# Patient Record
Sex: Male | Born: 1940 | Race: White | Hispanic: No | Marital: Married | Smoking: Never smoker
Health system: Southern US, Community
[De-identification: ages and names within clinical notes are randomized; demographics above are authoritative.]

## PROBLEM LIST (undated history)

## (undated) DIAGNOSIS — K219 Gastro-esophageal reflux disease without esophagitis: Secondary | ICD-10-CM

## (undated) DIAGNOSIS — T8859XA Other complications of anesthesia, initial encounter: Secondary | ICD-10-CM

## (undated) DIAGNOSIS — D45 Polycythemia vera: Secondary | ICD-10-CM

## (undated) DIAGNOSIS — R42 Dizziness and giddiness: Secondary | ICD-10-CM

## (undated) DIAGNOSIS — I10 Essential (primary) hypertension: Secondary | ICD-10-CM

## (undated) DIAGNOSIS — T4145XA Adverse effect of unspecified anesthetic, initial encounter: Secondary | ICD-10-CM

## (undated) HISTORY — PX: BACK SURGERY: SHX140

## (undated) HISTORY — PX: CIRCUMCISION: SUR203

## (undated) HISTORY — PX: VASECTOMY: SHX75

## (undated) HISTORY — PX: TONSILLECTOMY: SUR1361

## (undated) HISTORY — PX: HERNIA REPAIR: SHX51

## (undated) HISTORY — PX: ESOPHAGOGASTRODUODENOSCOPY ENDOSCOPY: SHX5814

## (undated) HISTORY — PX: APPENDECTOMY: SHX54

## (undated) HISTORY — PX: MOUTH SURGERY: SHX715

## (undated) HISTORY — PX: OTHER SURGICAL HISTORY: SHX169

---

## 2009-10-02 DEATH — deceased

## 2015-04-07 ENCOUNTER — Emergency Department (HOSPITAL_COMMUNITY): Payer: Medicare HMO

## 2015-04-07 ENCOUNTER — Encounter (HOSPITAL_COMMUNITY): Payer: Self-pay

## 2015-04-07 ENCOUNTER — Observation Stay (HOSPITAL_COMMUNITY)
Admission: EM | Admit: 2015-04-07 | Discharge: 2015-04-08 | Disposition: A | Discharge status: Home / Self Care | Payer: Medicare HMO | Attending: Internal Medicine | Admitting: Internal Medicine

## 2015-04-07 DIAGNOSIS — I10 Essential (primary) hypertension: Secondary | ICD-10-CM | POA: Insufficient documentation

## 2015-04-07 DIAGNOSIS — R0602 Shortness of breath: Secondary | ICD-10-CM | POA: Diagnosis not present

## 2015-04-07 DIAGNOSIS — D45 Polycythemia vera: Secondary | ICD-10-CM | POA: Diagnosis not present

## 2015-04-07 DIAGNOSIS — E785 Hyperlipidemia, unspecified: Secondary | ICD-10-CM

## 2015-04-07 DIAGNOSIS — E876 Hypokalemia: Secondary | ICD-10-CM | POA: Insufficient documentation

## 2015-04-07 DIAGNOSIS — Z8249 Family history of ischemic heart disease and other diseases of the circulatory system: Secondary | ICD-10-CM | POA: Insufficient documentation

## 2015-04-07 DIAGNOSIS — R079 Chest pain, unspecified: Secondary | ICD-10-CM | POA: Diagnosis present

## 2015-04-07 DIAGNOSIS — Z79899 Other long term (current) drug therapy: Secondary | ICD-10-CM | POA: Insufficient documentation

## 2015-04-07 DIAGNOSIS — K219 Gastro-esophageal reflux disease without esophagitis: Secondary | ICD-10-CM | POA: Diagnosis not present

## 2015-04-07 DIAGNOSIS — R072 Precordial pain: Principal | ICD-10-CM | POA: Insufficient documentation

## 2015-04-07 DIAGNOSIS — Z7982 Long term (current) use of aspirin: Secondary | ICD-10-CM | POA: Diagnosis not present

## 2015-04-07 HISTORY — DX: Gastro-esophageal reflux disease without esophagitis: K21.9

## 2015-04-07 HISTORY — DX: Essential (primary) hypertension: I10

## 2015-04-07 HISTORY — DX: Dizziness and giddiness: R42

## 2015-04-07 HISTORY — DX: Adverse effect of unspecified anesthetic, initial encounter: T41.45XA

## 2015-04-07 HISTORY — DX: Polycythemia vera: D45

## 2015-04-07 HISTORY — DX: Other complications of anesthesia, initial encounter: T88.59XA

## 2015-04-07 LAB — CBC WITH DIFFERENTIAL/PLATELET
Basophils Absolute: 0.1 10*3/uL (ref 0.0–0.1)
Basophils Relative: 1 % (ref 0–1)
EOS ABS: 0.1 10*3/uL (ref 0.0–0.7)
Eosinophils Relative: 1 % (ref 0–5)
HCT: 49.9 % (ref 39.0–52.0)
HEMOGLOBIN: 17.4 g/dL — AB (ref 13.0–17.0)
Lymphocytes Relative: 12 % (ref 12–46)
Lymphs Abs: 1.4 10*3/uL (ref 0.7–4.0)
MCH: 34.7 pg — AB (ref 26.0–34.0)
MCHC: 34.9 g/dL (ref 30.0–36.0)
MCV: 99.4 fL (ref 78.0–100.0)
MONO ABS: 0.6 10*3/uL (ref 0.1–1.0)
MONOS PCT: 5 % (ref 3–12)
Neutro Abs: 9.5 10*3/uL — ABNORMAL HIGH (ref 1.7–7.7)
Neutrophils Relative %: 81 % — ABNORMAL HIGH (ref 43–77)
Platelets: 356 10*3/uL (ref 150–400)
RBC: 5.02 MIL/uL (ref 4.22–5.81)
RDW: 12.4 % (ref 11.5–15.5)
WBC: 11.6 10*3/uL — ABNORMAL HIGH (ref 4.0–10.5)

## 2015-04-07 LAB — GLUCOSE, CAPILLARY: GLUCOSE-CAPILLARY: 139 mg/dL — AB (ref 65–99)

## 2015-04-07 LAB — LIPID PANEL
Cholesterol: 128 mg/dL (ref 0–200)
HDL: 23 mg/dL — ABNORMAL LOW (ref >40)
LDL CALC: 33 mg/dL (ref 0–99)
Total CHOL/HDL Ratio: 5.6 RATIO
Triglycerides: 361 mg/dL — ABNORMAL HIGH (ref <150)
VLDL: 72 mg/dL — ABNORMAL HIGH (ref 0–40)

## 2015-04-07 LAB — BASIC METABOLIC PANEL
Anion gap: 13 (ref 5–15)
BUN: 18 mg/dL (ref 6–20)
CALCIUM: 9.7 mg/dL (ref 8.9–10.3)
CO2: 27 mmol/L (ref 22–32)
Chloride: 98 mmol/L — ABNORMAL LOW (ref 101–111)
Creatinine, Ser: 1.13 mg/dL (ref 0.61–1.24)
GFR calc non Af Amer: >60 mL/min (ref >60)
Glucose, Bld: 224 mg/dL — ABNORMAL HIGH (ref 65–99)
POTASSIUM: 2.8 mmol/L — AB (ref 3.5–5.1)
SODIUM: 138 mmol/L (ref 135–145)

## 2015-04-07 LAB — BRAIN NATRIURETIC PEPTIDE: B Natriuretic Peptide: 44.3 pg/mL (ref 0.0–100.0)

## 2015-04-07 LAB — CBC
HCT: 44.3 % (ref 39.0–52.0)
HEMOGLOBIN: 15.6 g/dL (ref 13.0–17.0)
MCH: 34.9 pg — AB (ref 26.0–34.0)
MCHC: 35.2 g/dL (ref 30.0–36.0)
MCV: 99.1 fL (ref 78.0–100.0)
Platelets: 291 10*3/uL (ref 150–400)
RBC: 4.47 MIL/uL (ref 4.22–5.81)
RDW: 12.2 % (ref 11.5–15.5)
WBC: 9.6 10*3/uL (ref 4.0–10.5)

## 2015-04-07 LAB — TROPONIN I
Troponin I: <0.03 ng/mL (ref <0.031)
Troponin I: <0.03 ng/mL (ref <0.031)
Troponin I: <0.03 ng/mL (ref <0.031)

## 2015-04-07 LAB — TSH: TSH: 1.593 u[IU]/mL (ref 0.350–4.500)

## 2015-04-07 LAB — CREATININE, SERUM
CREATININE: 0.93 mg/dL (ref 0.61–1.24)
GFR calc Af Amer: >60 mL/min (ref >60)
GFR calc non Af Amer: >60 mL/min (ref >60)

## 2015-04-07 LAB — MRSA PCR SCREENING: MRSA by PCR: NEGATIVE

## 2015-04-07 LAB — I-STAT TROPONIN, ED: Troponin i, poc: 0 ng/mL (ref 0.00–0.08)

## 2015-04-07 MED ORDER — ONDANSETRON HCL 4 MG PO TABS: 4.0000 mg | ORAL_TABLET | Freq: Four times a day (QID) | ORAL | Status: DC | PRN

## 2015-04-07 MED ORDER — SODIUM CHLORIDE 0.9 % IJ SOLN
3.0000 mL | Freq: Two times a day (BID) | INTRAMUSCULAR | Status: DC
  Administered 2015-04-07 – 2015-04-08 (×2): 3 mL via INTRAVENOUS

## 2015-04-07 MED ORDER — HYDROMORPHONE HCL 1 MG/ML IJ SOLN
0.5000 mg | Freq: Once | INTRAMUSCULAR | Status: AC
  Administered 2015-04-07: 0.5 mg via INTRAVENOUS
  Filled 2015-04-07: qty 1

## 2015-04-07 MED ORDER — AMLODIPINE BESYLATE 10 MG PO TABS
10.0000 mg | ORAL_TABLET | Freq: Every day | ORAL | Status: DC
  Administered 2015-04-07 – 2015-04-08 (×2): 10 mg via ORAL
  Filled 2015-04-07 (×2): qty 1

## 2015-04-07 MED ORDER — ACETAMINOPHEN 325 MG PO TABS
650.0000 mg | ORAL_TABLET | Freq: Four times a day (QID) | ORAL | Status: DC | PRN
  Administered 2015-04-08: 650 mg via ORAL
  Filled 2015-04-07: qty 2

## 2015-04-07 MED ORDER — HYDROCHLOROTHIAZIDE 25 MG PO TABS
25.0000 mg | ORAL_TABLET | Freq: Every day | ORAL | Status: DC
  Administered 2015-04-07 – 2015-04-08 (×2): 25 mg via ORAL
  Filled 2015-04-07 (×2): qty 1

## 2015-04-07 MED ORDER — HYDROMORPHONE HCL 1 MG/ML IJ SOLN: 1.0000 mg | INTRAMUSCULAR | Status: DC | PRN

## 2015-04-07 MED ORDER — ACETAMINOPHEN 650 MG RE SUPP: 650.0000 mg | Freq: Four times a day (QID) | RECTAL | Status: DC | PRN

## 2015-04-07 MED ORDER — ONDANSETRON HCL 4 MG/2ML IJ SOLN
4.0000 mg | Freq: Four times a day (QID) | INTRAMUSCULAR | Status: DC | PRN
  Administered 2015-04-07: 4 mg via INTRAVENOUS
  Filled 2015-04-07: qty 2

## 2015-04-07 MED ORDER — MORPHINE SULFATE 4 MG/ML IJ SOLN
4.0000 mg | Freq: Once | INTRAMUSCULAR | Status: AC
  Administered 2015-04-07: 4 mg via INTRAVENOUS
  Filled 2015-04-07: qty 1

## 2015-04-07 MED ORDER — HYDROXYUREA 500 MG PO CAPS
500.0000 mg | ORAL_CAPSULE | Freq: Every day | ORAL | Status: DC
  Administered 2015-04-07 – 2015-04-08 (×2): 500 mg via ORAL
  Filled 2015-04-07 (×2): qty 1

## 2015-04-07 MED ORDER — ENOXAPARIN SODIUM 40 MG/0.4ML ~~LOC~~ SOLN
40.0000 mg | SUBCUTANEOUS | Status: DC
  Administered 2015-04-07: 40 mg via SUBCUTANEOUS
  Filled 2015-04-07: qty 0.4

## 2015-04-07 MED ORDER — SODIUM CHLORIDE 0.9 % IV SOLN: INTRAVENOUS | Status: DC

## 2015-04-07 MED ORDER — ASPIRIN 81 MG PO CHEW
81.0000 mg | CHEWABLE_TABLET | Freq: Every day | ORAL | Status: DC
  Administered 2015-04-07 – 2015-04-08 (×2): 81 mg via ORAL
  Filled 2015-04-07 (×2): qty 1

## 2015-04-07 MED ORDER — POTASSIUM CHLORIDE 10 MEQ/100ML IV SOLN
10.0000 meq | INTRAVENOUS | Status: AC
  Administered 2015-04-07 (×2): 10 meq via INTRAVENOUS
  Filled 2015-04-07 (×2): qty 100

## 2015-04-07 MED ORDER — SODIUM CHLORIDE 0.9 % IV BOLUS (SEPSIS)
1000.0000 mL | Freq: Once | INTRAVENOUS | Status: AC
  Administered 2015-04-07: 1000 mL via INTRAVENOUS

## 2015-04-07 MED ORDER — POTASSIUM CHLORIDE CRYS ER 20 MEQ PO TBCR
40.0000 meq | EXTENDED_RELEASE_TABLET | Freq: Once | ORAL | Status: AC
  Administered 2015-04-07: 40 meq via ORAL
  Filled 2015-04-07: qty 2

## 2015-04-07 MED ORDER — METOPROLOL TARTRATE 25 MG PO TABS
25.0000 mg | ORAL_TABLET | Freq: Two times a day (BID) | ORAL | Status: DC
  Administered 2015-04-07 – 2015-04-08 (×3): 25 mg via ORAL
  Filled 2015-04-07 (×3): qty 1

## 2015-04-07 MED ORDER — PANTOPRAZOLE SODIUM 40 MG IV SOLR
40.0000 mg | INTRAVENOUS | Status: AC
  Administered 2015-04-07: 40 mg via INTRAVENOUS
  Filled 2015-04-07: qty 40

## 2015-04-07 MED ORDER — PANTOPRAZOLE SODIUM 40 MG PO TBEC
40.0000 mg | DELAYED_RELEASE_TABLET | Freq: Every day | ORAL | Status: DC
Start: 2015-04-07 — End: 2015-04-08
  Administered 2015-04-07 – 2015-04-08 (×2): 40 mg via ORAL
  Filled 2015-04-07 (×2): qty 1

## 2015-04-07 NOTE — ED Provider Notes (Signed)
Medical screening examination/treatment/procedure(s) were conducted as a shared visit with non-physician practitioner(s) and myself.  I personally evaluated the patient during the encounter.   EKG Interpretation   Date/Time:  Wednesday April 07 2015 04:09:31 EDT Ventricular Rate:  82 PR Interval:  143 QRS Duration: 115 QT Interval:  417 QTC Calculation: 487 R Axis:   -67 Text Interpretation:  Sinus rhythm Left anterior fascicular block Left  ventricular hypertrophy Nonspecific T abnormalities, inferior leads and  lateral leads No old tracing to compare Confirmed by Timaya Bojarski,  DO, Elowyn Raupp  ([id number]) on 04/07/2015 4:13:33 AM      Pt is a 74 y.o. male with history of hypertension who presents emergency department with chest pressure, stabbing chest pain, shortness of breath, dizziness and nausea. EKG shows T-wave inversions in inferolateral leads with no old for comparison. Troponin negative. Will admit for ACS rule out.  [place], DO 04/07/15 [phone]

## 2015-04-07 NOTE — ED Notes (Signed)
Report given to Ria Comment, RN on floor

## 2015-04-07 NOTE — ED Notes (Signed)
rn called to give report, floor assigning nurse, floor will call back to get report.

## 2015-04-07 NOTE — ED Provider Notes (Signed)
CSN: [id number]     Arrival date & time 04/07/15  0405 History   First MD Initiated Contact with Patient 04/07/15 [phone]     Chief Complaint  Patient presents with  . Chest Pain     (Consider location/radiation/quality/duration/timing/severity/associated sxs/prior Treatment) HPI Comments: 74 year old male with history of hypertension and esophageal reflux presents to the emergency department for further evaluation of chest pain. Patient reports that chest pain began 4.5 hours ago. Pain began as a tightness at first with shortness of breath. It has progressed to a sharp, stabbing pain in the patient's central chest. Patient denies any radiation of the pain. He has not had similar pain in the past. Symptoms have been associated with nausea as well as lightheadedness. Patient denies any vertiginous symptoms, vomiting, syncope, and leg swelling. He took Tums without relief of his symptoms as he thought his pain was initially associated with his history of reflux. Patient reports a history of ACS in his father at the age of 12; this was a fatal MI. Patient denies a personal history of ACS, hyperlipidemia, CAD, and diabetes mellitus. He is a never smoker. He reports a normal stress test a little more than a year ago. He is not followed by a cardiologist.  PCP - Dr. Melford Aase  Patient is a 74 y.o. male presenting with chest pain. The history is provided by the patient. No language interpreter was used.  Chest Pain Associated symptoms: nausea and shortness of breath   Associated symptoms: no diaphoresis     Past Medical History  Diagnosis Date  . Hypertension   . GERD (gastroesophageal reflux disease)    History reviewed. No pertinent past surgical history. History reviewed. No pertinent family history. History  Substance Use Topics  . Smoking status: Never Smoker   . Smokeless tobacco: Not on file  . Alcohol Use: No    Review of Systems  Constitutional: Negative for diaphoresis.  Respiratory:  Positive for shortness of breath.   Cardiovascular: Positive for chest pain.  Gastrointestinal: Positive for nausea.  Neurological: Positive for light-headedness.  All other systems reviewed and are negative.   Allergies  Review of patient's allergies indicates not on file.  Home Medications   Prior to Admission medications   Not on File   BP 146/80 mmHg  Pulse 85  Temp(Src) 98.2 F (36.8 C) (Oral)  Resp 18  SpO2 100%   Physical Exam  Constitutional: He is oriented to person, place, and time. He appears well-developed and well-nourished. No distress.  Nontoxic/nonseptic appearing  HENT:  Head: Normocephalic and atraumatic.  Eyes: Conjunctivae and EOM are normal. No scleral icterus.  Neck: Normal range of motion.  Cardiovascular: Normal rate, regular rhythm and intact distal pulses.   Pulmonary/Chest: Effort normal and breath sounds normal. No respiratory distress. He has no wheezes. He has no rales.  Respirations even and unlabored  Abdominal: Soft. He exhibits no distension. There is no tenderness. There is no rebound and no guarding.  Soft, nontender abdomen  Musculoskeletal: Normal range of motion. He exhibits no edema.  Neurological: He is alert and oriented to person, place, and time. He exhibits normal muscle tone. Coordination normal.  GCS 15. Speech is goal oriented. Patient moves extremities without ataxia. He ambulates with steady gait.  Skin: Skin is warm and dry. No rash noted. He is not diaphoretic. No erythema. No pallor.  Psychiatric: He has a normal mood and affect. His behavior is normal.  Nursing note and vitals reviewed.   ED Course  Procedures (including critical care time) Labs Review Labs Reviewed  BASIC METABOLIC PANEL - Abnormal; Notable for the following:    Potassium 2.8 (*)    Chloride 98 (*)    Glucose, Bld 224 (*)    All other components within normal limits  CBC WITH DIFFERENTIAL/PLATELET - Abnormal; Notable for the following:    WBC  11.6 (*)    Hemoglobin 17.4 (*)    MCH 34.7 (*)    Neutrophils Relative % 81 (*)    Neutro Abs 9.5 (*)    All other components within normal limits  BRAIN NATRIURETIC PEPTIDE  I-STAT TROPOININ, ED    Imaging Review Dg Chest 2 View  04/07/2015   CLINICAL DATA:  Centralized chest pain  EXAM: CHEST  2 VIEW  COMPARISON:  None.  FINDINGS: Normal heart size and mediastinal contours. No acute infiltrate or edema. No effusion or pneumothorax. No acute osseous findings. Gas under the left diaphragm appears to be in the gastric fundus.  IMPRESSION: No active cardiopulmonary disease.   Electronically Signed   By: Monte Fantasia M.D.   On: 04/07/2015 04:31     EKG Interpretation   Date/Time:  Wednesday April 07 2015 04:09:31 EDT Ventricular Rate:  82 PR Interval:  143 QRS Duration: 115 QT Interval:  417 QTC Calculation: 487 R Axis:   -67 Text Interpretation:  Sinus rhythm Left anterior fascicular block Left  ventricular hypertrophy Nonspecific T abnormalities, inferior leads and  lateral leads No old tracing to compare Confirmed by WARD,  DO, KRISTEN  ([id number]) on 04/07/2015 4:13:33 AM       Heart Score - 5 (mod suspicion, T wave inversion inf/lat leads, age, risk factors, normal trop)  MDM   Final diagnoses:  Chest pain  Hypokalemia    Plan admit for ACS rule out. Initial cardiac work up is reassuring. EKG does show flipped T waves in inferior and lateral leads. Trop negative. Heart Score is 5 c/w moderate risk of MACE. Plan discussed with Dr. Posey Pronto of [facility] who will admit.   Filed Vitals:   04/07/15 0415  BP: 146/80  Pulse: 85  Temp: 98.2 F (36.8 C)  TempSrc: Oral  Resp: 18  SpO2: 100%     Antonietta Breach, PA-C 04/07/15 [phone]

## 2015-04-07 NOTE — ED Notes (Signed)
Pt complains of centralized chest pain that he describes as a stabbing pain that started about 4 hours ago, he also complains of being dizzy and nauseated.

## 2015-04-07 NOTE — H&P (Addendum)
[facility] History and Physical  Philip Gay ZOX:[id number] DOB: [birth year] DOA: 04/07/2015  Referring physician: ER physician: Antonietta Breach, PA PCP: Chesley Noon, MD   Chief Complaint: chest pain   HPI:  74 -year-old male with past medical history of hypertension, polycythemia vera, GERD who presented to [facility] ED with concern for progressively worsening sharp, substernal chest pain, 10 out of 10 in intensity, nonradiating, sharp, constant, that woke him up from sleep earlier this morning. Patient reports not having similar episodes of this type of pain before. He reports pain is present at rest and with movement. He was thinking that this may be GERD but there was no significant symptomatic improvement. He's pain actually got better with Dilaudid given in ED. Patient reports having parents who have history of heart attack in his 63s. No complaints of shortness of breath or palpitations. No fevers or chills. No lightheadedness or dizziness or loss of consciousness. No reports of abdominal pain, nausea or vomiting. No reports of blood in the stool or urine. No diarrhea or constipation.  In ED, patient was hemodynamically stable. His initial blood pressure was 146/80 but then was as high as 171/78. He is initial troponin level was within normal limits. Potassium was 2.8, white blood cell count was 11.7 and hemoglobin was 17.4.   he's 12-lead EKG showed sinus rhythm on the admission. Because of ongoing chest pain he was admitted for observation in step down unit for first 24 hours.   Assessment & Plan    Principal problem: Chest pain, precordial - Rule out ACS. Risk factors include hypertension, dyslipidemia, family history. - The troponin level was within normal limits. Cycle cardiac enzymes. - Obtain 2-D echo. - Continue pain management efforts - Continue daily aspirin. - Check lipid panel  Active problems: Essential hypertension - Continue Norvasc, HCTZ and  metoprolol  Polycythemia vera - Continue hydroxyurea  Hypokalemia - Likely from HCTZ - Supplemented and repeat BMP pending  GERD - Continue PPI therapy   DVT prophylaxis:  - SCDs bilaterally  Radiological Exams on Admission: Dg Chest 2 View 04/07/2015  No active cardiopulmonary disease.   Electronically Signed   By: Monte Fantasia M.D.   On: 04/07/2015 04:31    EKG: I have personally reviewed EKG. EKG shows sinus rhythm   Code Status: Full Family Communication: Plan of care discussed with the patient  Disposition Plan: Admit for further evaluation, SDU  Leisa Lenz, MD  [facility] Pager [phone]  Time spent in minutes: 75 minutes  Review of Systems:  Constitutional: Negative for fever, chills and malaise/fatigue. Negative for diaphoresis.  HENT: Negative for hearing loss, ear pain, nosebleeds, congestion, sore throat, neck pain, tinnitus and ear discharge.   Eyes: Negative for blurred vision, double vision, photophobia, pain, discharge and redness.  Respiratory: Negative for cough, hemoptysis, sputum production, shortness of breath, wheezing and stridor.   Cardiovascular: per HPI.  Gastrointestinal: Negative for nausea, vomiting and abdominal pain. Negative for heartburn, constipation, blood in stool and melena.  Genitourinary: Negative for dysuria, urgency, frequency, hematuria and flank pain.  Musculoskeletal: Negative for myalgias, back pain, joint pain and falls.  Skin: Negative for itching and rash.  Neurological: Negative for dizziness and weakness. Negative for tingling, tremors, sensory change, speech change, focal weakness, loss of consciousness and headaches.  Endo/Heme/Allergies: Negative for environmental allergies and polydipsia. Does not bruise/bleed easily.  Psychiatric/Behavioral: Negative for suicidal ideas. The patient is not nervous/anxious.      Past Medical History  Diagnosis Date  .  Hypertension   . GERD (gastroesophageal reflux disease)    . Complication of anesthesia     Hard to wake up  . Vertigo   . Polycythemia vera    Past Surgical History  Procedure Laterality Date  . Tonsillectomy    . Appendectomy    . Tumors from back    . Mouth surgery    . Circumcision    . Colonscopy    . Esophagogastroduodenoscopy endoscopy    . Vasectomy    . Hernia repair      right side  . Back surgery    . Hernia removed from the back     Social History:  reports that he has never smoked. He has never used smokeless tobacco. He reports that he does not drink alcohol or use illicit drugs.  No Known Allergies  Family History: Father and mother had history of heart attack   Prior to Admission medications   Medication Sig Start Date End Date Taking? Authorizing Provider  amLODipine (NORVASC) 10 MG tablet Take 1 tablet by mouth daily. 03/25/15  Yes Historical Provider, MD  aspirin 81 MG chewable tablet Chew 81 mg by mouth daily.   Yes Historical Provider, MD  hydrochlorothiazide (HYDRODIURIL) 25 MG tablet Take 1 tablet by mouth every morning. 01/18/15  Yes Historical Provider, MD  hydroxyurea (HYDREA) 500 MG capsule Take 500 mg by mouth daily. May take with food to minimize GI side effects.   Yes Historical Provider, MD  metoprolol tartrate (LOPRESSOR) 25 MG tablet Take 1 tablet by mouth 2 (two) times daily. 01/18/15  Yes Historical Provider, MD  omeprazole (PRILOSEC) 20 MG capsule Take 20 mg by mouth daily.   Yes Historical Provider, MD   Physical Exam: Filed Vitals:   04/07/15 0900 04/07/15 0950 04/07/15 1000 04/07/15 1100  BP: 171/78  146/74 160/61  Pulse: 85  79 78  Temp:      TempSrc:      Resp: 14  14 21   Height:  5\' 8"  (1.727 m)    Weight:  87.5 kg (192 lb 14.4 oz)    SpO2: 97%  95% 91%    Physical Exam  Constitutional: Appears well-developed and well-nourished. No distress.  HENT: Normocephalic. No tonsillar erythema or exudates Eyes: Conjunctivae and EOM are normal. PERRLA, no scleral icterus.  Neck: Normal ROM.  Neck supple. No JVD. No tracheal deviation. No thyromegaly.  CVS: RRR, S1/S2 +, no murmurs, no gallops, no carotid bruit.  Pulmonary: Effort and breath sounds normal, no stridor, rhonchi, wheezes, rales.  Abdominal: Soft. BS +,  no distension, tenderness, rebound or guarding.  Musculoskeletal: Normal range of motion. No edema and no tenderness.  Lymphadenopathy: No lymphadenopathy noted, cervical, inguinal. Neuro: Alert. Normal reflexes, muscle tone coordination. No focal neurologic deficits. Skin: Skin is warm and dry. No rash noted.  No erythema. No pallor.  Psychiatric: Normal mood and affect. Behavior, judgment, thought content normal.   Labs on Admission:  Basic Metabolic Panel:  Recent Labs Lab 04/07/15 0429 04/07/15 1010  NA 138  --   K 2.8*  --   CL 98*  --   CO2 27  --   GLUCOSE 224*  --   BUN 18  --   CREATININE 1.13 0.93  CALCIUM 9.7  --    Liver Function Tests: No results for input(s): AST, ALT, ALKPHOS, BILITOT, PROT, ALBUMIN in the last 168 hours. No results for input(s): LIPASE, AMYLASE in the last 168 hours. No results for input(s): AMMONIA  in the last 168 hours. CBC:  Recent Labs Lab 04/07/15 0429 04/07/15 1010  WBC 11.6* 9.6  NEUTROABS 9.5*  --   HGB 17.4* 15.6  HCT 49.9 44.3  MCV 99.4 99.1  PLT 356 291   Cardiac Enzymes:  Recent Labs Lab 04/07/15 0557 04/07/15 1010  TROPONINI <0.03 <0.03   BNP: Invalid input(s): POCBNP CBG: No results for input(s): GLUCAP in the last 168 hours.  If 7PM-7AM, please contact night-coverage [email or web address] Password [email or web address] 04/07/2015, 11:49 AM

## 2015-04-07 NOTE — ED Notes (Signed)
Pt alert and oriented x4. Respirations even and unlabored, bilateral symmetrical rise and fall of chest. Skin warm and dry. In no acute distress. Denies needs.   

## 2015-04-08 ENCOUNTER — Observation Stay (HOSPITAL_COMMUNITY): Payer: Medicare HMO

## 2015-04-08 DIAGNOSIS — K219 Gastro-esophageal reflux disease without esophagitis: Secondary | ICD-10-CM | POA: Diagnosis not present

## 2015-04-08 DIAGNOSIS — I10 Essential (primary) hypertension: Secondary | ICD-10-CM | POA: Diagnosis not present

## 2015-04-08 DIAGNOSIS — E876 Hypokalemia: Secondary | ICD-10-CM

## 2015-04-08 DIAGNOSIS — R079 Chest pain, unspecified: Secondary | ICD-10-CM | POA: Diagnosis not present

## 2015-04-08 DIAGNOSIS — R072 Precordial pain: Secondary | ICD-10-CM | POA: Diagnosis not present

## 2015-04-08 LAB — BASIC METABOLIC PANEL
Anion gap: 9 (ref 5–15)
BUN: 13 mg/dL (ref 6–20)
CHLORIDE: 96 mmol/L — AB (ref 101–111)
CO2: 29 mmol/L (ref 22–32)
CREATININE: 1.09 mg/dL (ref 0.61–1.24)
Calcium: 9.2 mg/dL (ref 8.9–10.3)
GFR calc Af Amer: >60 mL/min (ref >60)
GFR calc non Af Amer: >60 mL/min (ref >60)
Glucose, Bld: 181 mg/dL — ABNORMAL HIGH (ref 65–99)
POTASSIUM: 2.6 mmol/L — AB (ref 3.5–5.1)
Sodium: 134 mmol/L — ABNORMAL LOW (ref 135–145)

## 2015-04-08 LAB — CBC
HCT: 42.7 % (ref 39.0–52.0)
Hemoglobin: 15 g/dL (ref 13.0–17.0)
MCH: 34.6 pg — AB (ref 26.0–34.0)
MCHC: 35.1 g/dL (ref 30.0–36.0)
MCV: 98.6 fL (ref 78.0–100.0)
PLATELETS: 248 10*3/uL (ref 150–400)
RBC: 4.33 MIL/uL (ref 4.22–5.81)
RDW: 12.2 % (ref 11.5–15.5)
WBC: 10 10*3/uL (ref 4.0–10.5)

## 2015-04-08 LAB — GLUCOSE, CAPILLARY: Glucose-Capillary: 170 mg/dL — ABNORMAL HIGH (ref 65–99)

## 2015-04-08 MED ORDER — POTASSIUM CHLORIDE CRYS ER 20 MEQ PO TBCR: 40.0000 meq | EXTENDED_RELEASE_TABLET | Freq: Once | ORAL | Status: DC

## 2015-04-08 MED ORDER — POTASSIUM CHLORIDE 20 MEQ/15ML (10%) PO SOLN
40.0000 meq | Freq: Once | ORAL | Status: AC
  Administered 2015-04-08: 40 meq via ORAL
  Filled 2015-04-08: qty 30

## 2015-04-08 MED ORDER — POTASSIUM CHLORIDE ER 20 MEQ PO TBCR: 20.0000 meq | EXTENDED_RELEASE_TABLET | Freq: Every day | ORAL | Status: AC

## 2015-04-08 MED ORDER — OMEGA-3-ACID ETHYL ESTERS 1 G PO CAPS: 1.0000 g | ORAL_CAPSULE | Freq: Every day | ORAL | Status: DC

## 2015-04-08 MED ORDER — POTASSIUM CHLORIDE ER 20 MEQ PO TBCR: 20.0000 meq | EXTENDED_RELEASE_TABLET | Freq: Every day | ORAL | Status: DC

## 2015-04-08 MED ORDER — OMEGA-3-ACID ETHYL ESTERS 1 G PO CAPS: 1.0000 g | ORAL_CAPSULE | Freq: Every day | ORAL | Status: AC

## 2015-04-08 MED ORDER — POTASSIUM CHLORIDE CRYS ER 20 MEQ PO TBCR
40.0000 meq | EXTENDED_RELEASE_TABLET | Freq: Once | ORAL | Status: AC
  Administered 2015-04-08: 40 meq via ORAL
  Filled 2015-04-08: qty 2

## 2015-04-08 NOTE — Discharge Summary (Addendum)
Physician Discharge Summary  Shawna Kiener VPX:[id number] DOB: 1940-10-12 DOA: 04/07/2015  PCP: Chesley Noon, MD  Admit date: 04/07/2015 Discharge date: 04/08/2015  Recommendations for Outpatient Follow-up:  1. Continue potassium supplementation for next 6 days on discharge. Follow-up with your primary care physician 04/14/2015 per scheduled time. Please have your primary care physician recheck potassium level.  Discharge Diagnoses:  Active Problems:   Chest pain    Discharge Condition: stable   Diet recommendation: as tolerated   History of present illness:  74 -year-old male with past medical history of hypertension, polycythemia vera, GERD who presented to [facility] ED with concern for progressively worsening sharp, substernal chest pain, 10 out of 10 in intensity, nonradiating, sharp, constant, that woke him up from sleep earlier on the morning prior to this admission. Pain was improved with Dilaudid given in ED.  In ED, patient was hemodynamically stable. His initial blood pressure was 146/80 but then was as high as 171/78. He is initial troponin level was within normal limits. Potassium was 2.8, white blood cell count was 11.7 and hemoglobin was 17.4.  he's 12-lead EKG showed sinus rhythm on the admission. Because of ongoing chest pain he was admitted for observation in step down unit for first 24 hours.  Hospital Course:   Assessment & Plan    Principal problem: Chest pain, precordial - Risk factors include hypertension, dyslipidemia, family history. - Troponins are all within normal limits. - 2-D echo demonstrated essentially normal ejection fraction, no regional wall abnormalities - Patient will continue daily aspirin. - LDL was within normal limits. Triglycerides were 361 with low HDL of 23. We'll prescribe omega-3 supplementation.  Active problems: Essential hypertension - Continue Norvasc, HCTZ and metoprolol  Polycythemia vera - Continue hydroxyurea on  discharge   Hypokalemia - Likely from HCTZ - Supplemented  - Continue daily potassium supplementation for 5 days on discharge   GERD - Continue PPI therapy   DVT prophylaxis:  - SCDs bilaterally   Signed:  Leisa Lenz, MD  [facility] 04/08/2015, 10:18 AM  Pager #: [phone]  Time spent in minutes: less than 30 minutes   Discharge Exam: Filed Vitals:   04/08/15 0757  BP:   Pulse:   Temp: 98.5 F (36.9 C)  Resp:    Filed Vitals:   04/08/15 0326 04/08/15 0345 04/08/15 0500 04/08/15 0757  BP:  165/77    Pulse:  80    Temp: 99.2 F (37.3 C)   98.5 F (36.9 C)  TempSrc: Oral   Oral  Resp:  17    Height:      Weight:   87.9 kg (193 lb 12.6 oz)   SpO2:  97%      General: Pt is alert, follows commands appropriately, not in acute distress Cardiovascular: Regular rate and rhythm, S1/S2 +, no murmurs Respiratory: Clear to auscultation bilaterally, no wheezing, no crackles, no rhonchi Abdominal: Soft, non tender, non distended, bowel sounds +, no guarding Extremities: no edema, no cyanosis, pulses palpable bilaterally DP and PT Neuro: Grossly nonfocal  Discharge Instructions  Discharge Instructions    Call MD for:  difficulty breathing, headache or visual disturbances    Complete by:  As directed      Call MD for:  persistant nausea and vomiting    Complete by:  As directed      Call MD for:  severe uncontrolled pain    Complete by:  As directed      Diet - low sodium heart healthy  Complete by:  As directed      Discharge instructions    Complete by:  As directed   1. Continue potassium supplementation for next 6 days on discharge. Follow-up with your primary care physician 04/14/2015 per scheduled time. Please have your primary care physician recheck potassium level.     Increase activity slowly    Complete by:  As directed             Medication List    TAKE these medications        amLODipine 10 MG tablet  Commonly known as:  NORVASC   Take 1 tablet by mouth daily.     aspirin 81 MG chewable tablet  Chew 81 mg by mouth daily.     hydrochlorothiazide 25 MG tablet  Commonly known as:  HYDRODIURIL  Take 1 tablet by mouth every morning.     hydroxyurea 500 MG capsule  Commonly known as:  HYDREA  Take 500 mg by mouth daily. May take with food to minimize GI side effects.     metoprolol tartrate 25 MG tablet  Commonly known as:  LOPRESSOR  Take 1 tablet by mouth 2 (two) times daily.     omega-3 acid ethyl esters 1 G capsule  Commonly known as:  LOVAZA  Take 1 capsule (1 g total) by mouth daily.     omeprazole 20 MG capsule  Commonly known as:  PRILOSEC  Take 20 mg by mouth daily.     Potassium Chloride ER 20 MEQ Tbcr  Take 20 mEq by mouth daily.            Follow-up Information    Follow up with Chesley Noon, MD. Schedule an appointment as soon as possible for a visit on 04/14/2015.   Specialty:  Family Medicine   Why:  Follow up appt after recent hospitalization   Contact information:   [place] [address] [phone]        The results of significant diagnostics from this hospitalization (including imaging, microbiology, ancillary and laboratory) are listed below for reference.    Significant Diagnostic Studies: Dg Chest 2 View  04/07/2015   CLINICAL DATA:  Centralized chest pain  EXAM: CHEST  2 VIEW  COMPARISON:  None.  FINDINGS: Normal heart size and mediastinal contours. No acute infiltrate or edema. No effusion or pneumothorax. No acute osseous findings. Gas under the left diaphragm appears to be in the gastric fundus.  IMPRESSION: No active cardiopulmonary disease.   Electronically Signed   By: Monte Fantasia M.D.   On: 04/07/2015 04:31    Microbiology: Recent Results (from the past 240 hour(s))  MRSA PCR Screening     Status: None   Collection Time: 04/07/15  8:26 AM  Result Value Ref Range Status   MRSA by PCR NEGATIVE NEGATIVE Final    Comment:        The  GeneXpert MRSA Assay (FDA approved for NASAL specimens only), is one component of a comprehensive MRSA colonization surveillance program. It is not intended to diagnose MRSA infection nor to guide or monitor treatment for MRSA infections.      Labs: Basic Metabolic Panel:  Recent Labs Lab 04/07/15 0429 04/07/15 1010 04/08/15 0345  NA 138  --  134*  K 2.8*  --  2.6*  CL 98*  --  96*  CO2 27  --  29  GLUCOSE 224*  --  181*  BUN 18  --  13  CREATININE 1.13  0.93 1.09  CALCIUM 9.7  --  9.2   Liver Function Tests: No results for input(s): AST, ALT, ALKPHOS, BILITOT, PROT, ALBUMIN in the last 168 hours. No results for input(s): LIPASE, AMYLASE in the last 168 hours. No results for input(s): AMMONIA in the last 168 hours. CBC:  Recent Labs Lab 04/07/15 0429 04/07/15 1010 04/08/15 0345  WBC 11.6* 9.6 10.0  NEUTROABS 9.5*  --   --   HGB 17.4* 15.6 15.0  HCT 49.9 44.3 42.7  MCV 99.4 99.1 98.6  PLT 356 291 248   Cardiac Enzymes:  Recent Labs Lab 04/07/15 0557 04/07/15 1010 04/07/15 1425 04/07/15 2100  TROPONINI <0.03 <0.03 <0.03 <0.03   BNP: BNP (last 3 results)  Recent Labs  04/07/15 0429  BNP 44.3    ProBNP (last 3 results) No results for input(s): PROBNP in the last 8760 hours.  CBG:  Recent Labs Lab 04/07/15 1511 04/08/15 0753  GLUCAP 139* 170*

## 2015-04-08 NOTE — Progress Notes (Signed)
  Echocardiogram 2D Echocardiogram has been performed.  Darlina Sicilian M 04/08/2015, 8:37 AM

## 2015-04-08 NOTE — Progress Notes (Signed)
CRITICAL VALUE ALERT  Critical value received:  K 2.6  Date of notification:  04/08/15  Time of notification:  0418   Critical value read back:Yes.    Nurse who received alert:  Reche Dixon  MD notified (1st page):  K Schorr  Time of first page:  0420  MD notified (2nd page):  Time of second page:  Responding MD:  Lamar Blinks  Time MD responded:  [phone]

## 2015-04-08 NOTE — Discharge Instructions (Signed)

## 2016-11-20 IMAGING — CR DG CHEST 2V
2 series · 2 of 2 positions shown · non-contrast
Comparison: None.

CLINICAL DATA: Centralized chest pain

EXAM:
CHEST  2 VIEW

[w chest pa]
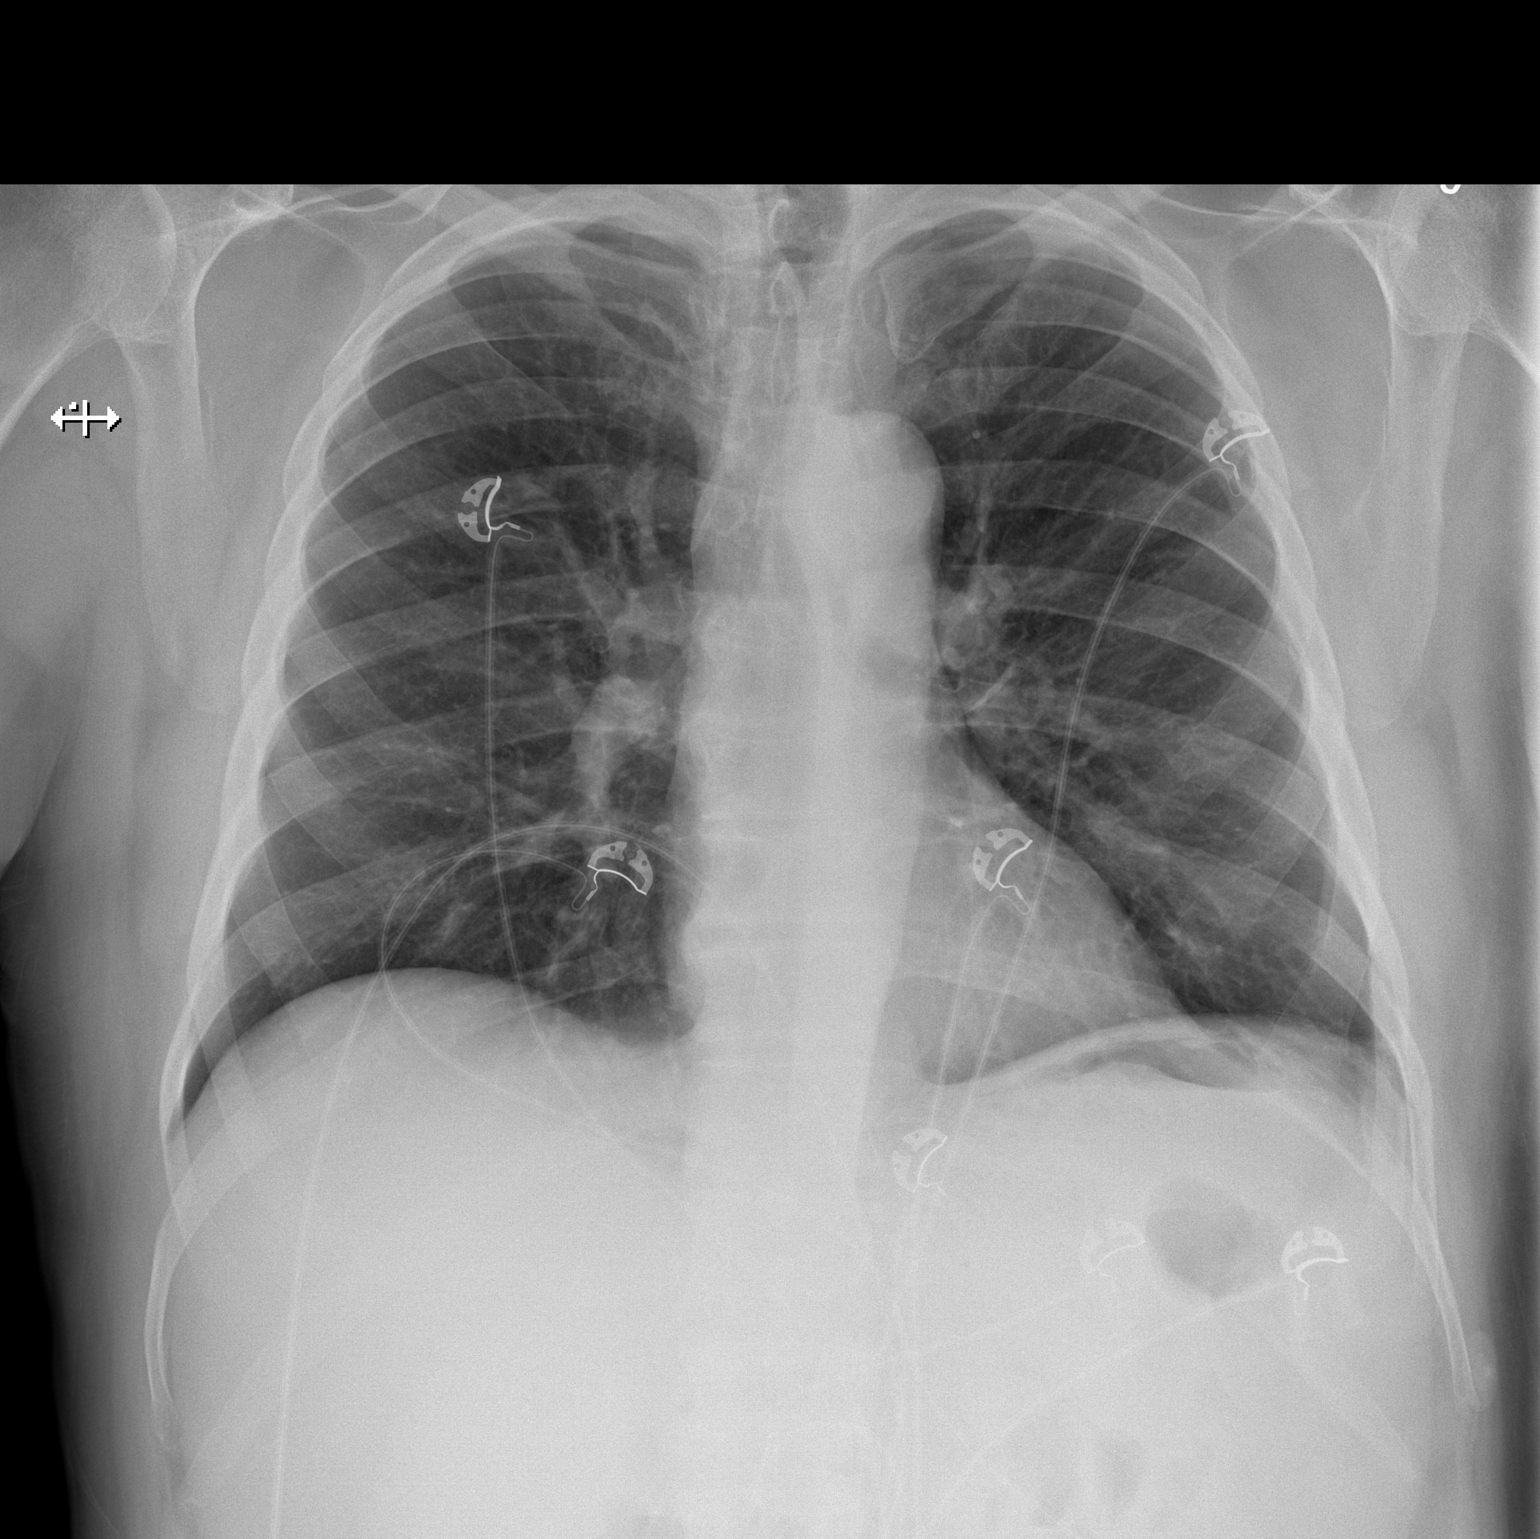

[w chest lat]
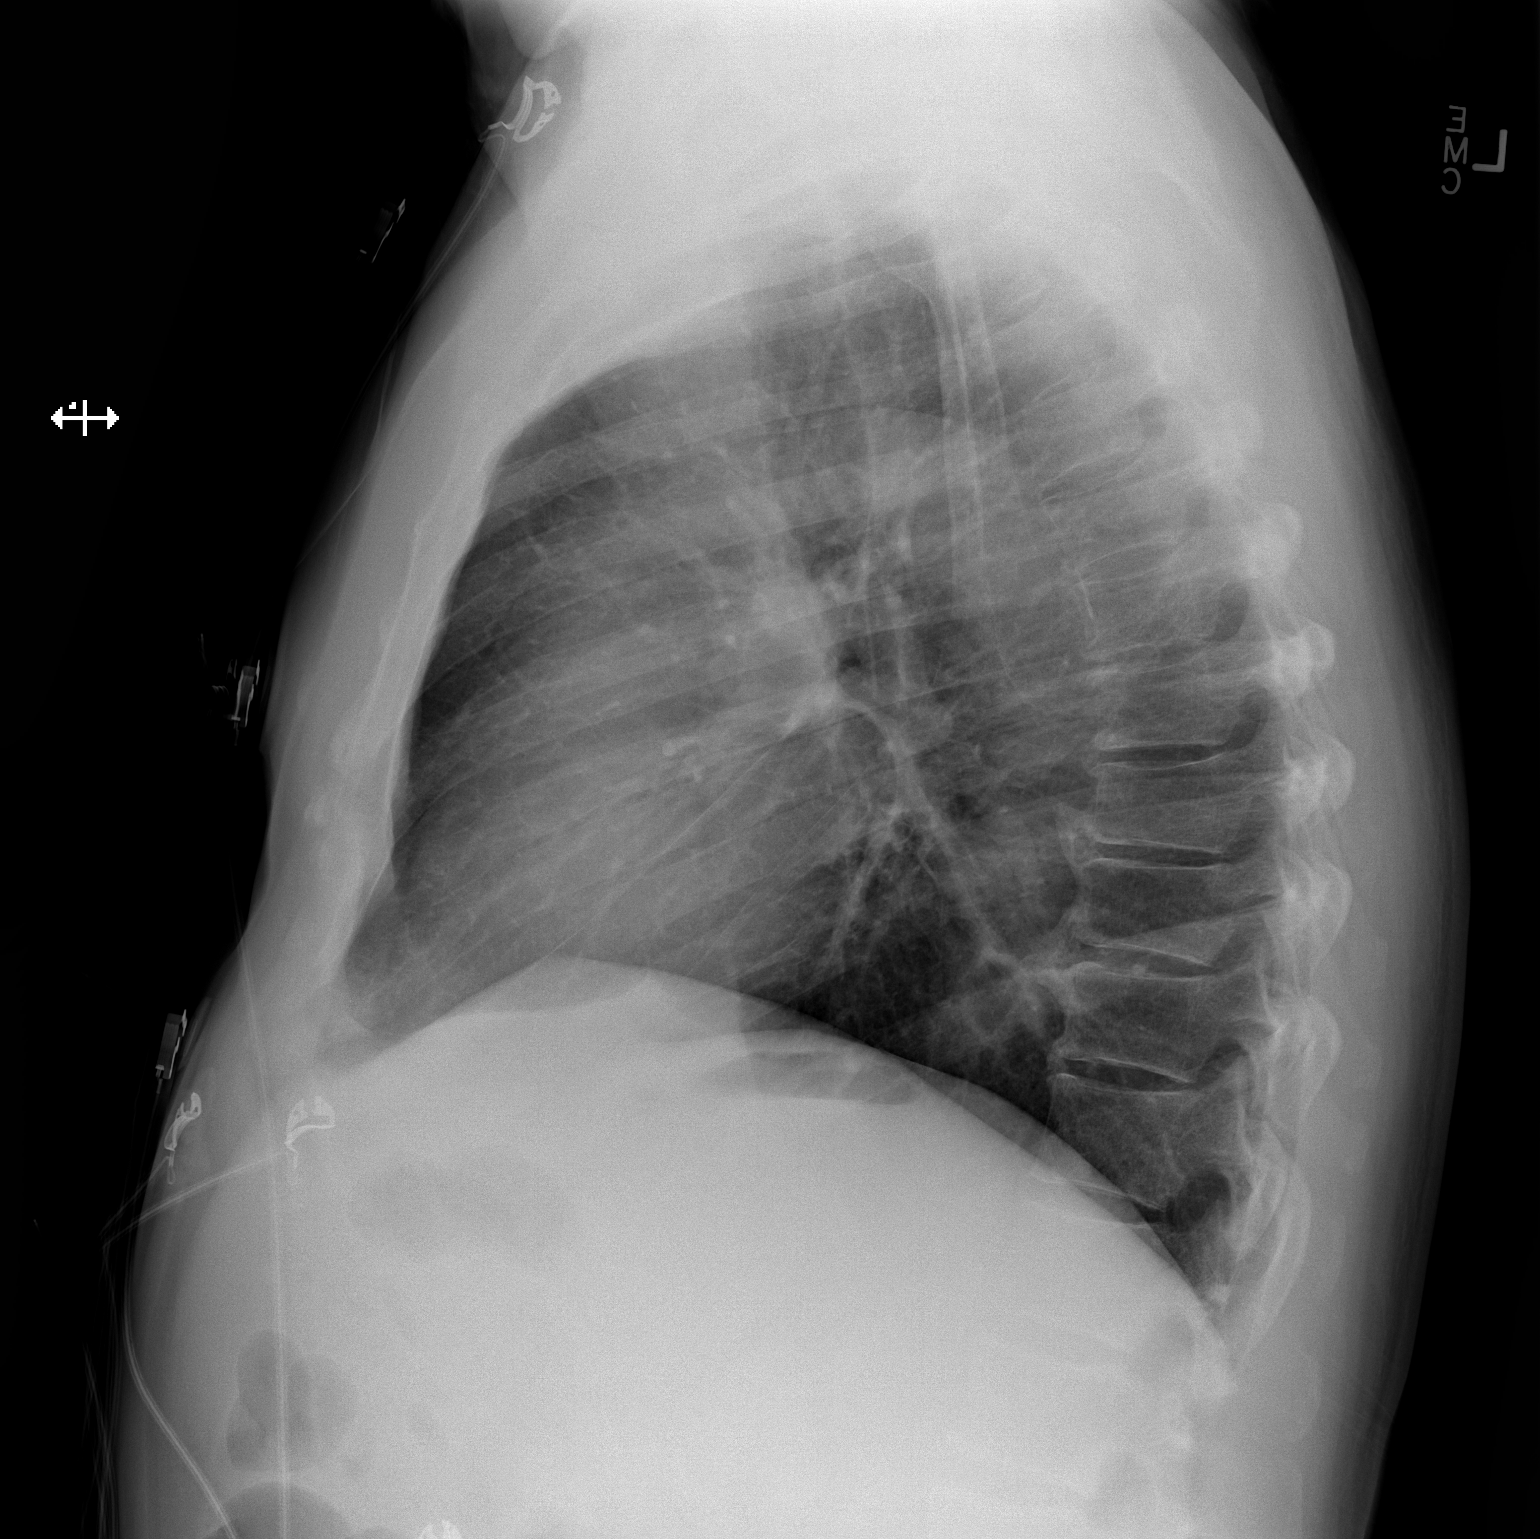

[2 of 2 positions shown; findings below may reference images not displayed]

FINDINGS: Normal heart size and mediastinal contours. No acute infiltrate or
edema. No effusion or pneumothorax. No acute osseous findings. Gas
under the left diaphragm appears to be in the gastric fundus.
IMPRESSION: No active cardiopulmonary disease.

## 2019-12-01 MED ORDER — IOPAMIDOL (ISOVUE-370) INJECTION 76%
60.00 | INTRAVENOUS | Status: DC
Start: ? — End: 2019-12-01

## 2023-12-04 ENCOUNTER — Encounter: Payer: Self-pay | Admitting: Gastroenterology

## 2024-01-21 NOTE — Progress Notes (Signed)
 Chief Complaint: Hoarseness, cough Primary GI MD: Para Bold  HPI: 83 year old male history of GERD, hypertension, polycythemia vera, presents for evaluation of hoarseness and cough as well as voice change.  Previous patient of [facility].  History of ERCP in 2017 with biliary sphincterotomy and biliary balloon sweeps with concerns for cholangitis.  Recently had EGD/colonoscopy in 2019 for change in bowel habits (constipation with overflow) and melena.  We do not have these records available.  Discussed the use of AI scribe software for clinical note transcription with the patient, who gave verbal consent to proceed.  History of Present Illness He has been experiencing a persistent hacking cough and voice changes for an extended period. When speaking for long durations, he becomes hoarse and is unable to sing as his voice diminishes. Despite taking antacids for nearly 20 years, these symptoms have not improved. He is currently on pantoprazole  40 mg, which effectively controls his heartburn but does not alleviate his throat symptoms.  He denies any exacerbation of his cough or voice changes with eating, although he occasionally has difficulty swallowing, particularly with liquids. There is no coughing when swallowing liquids.   He reports no current issues with black stools or rectal bleeding.  Following gallbladder removal, he experienced a change in bowel habits, with stools being soft  and sometimes loose. He consumes Ensure daily but does not take fiber supplements. His stools are described as soft, smaller in size, and not well-formed. Though states in the years following gallbladder removal they have continued to improve      PREVIOUS GI WORKUP   EGD/colonoscopy 2019 for change in bowel habits, dark stools, GERD (we do not have this record available)  ERCP 2017 for concern of cholangitis  Colonoscopy 2013 reportedly normal  Past Medical History:  Diagnosis Date   Complication of  anesthesia    Hard to wake up   GERD (gastroesophageal reflux disease)    Hypertension    Polycythemia vera (HCC)    Vertigo     Past Surgical History:  Procedure Laterality Date   APPENDECTOMY     BACK SURGERY     CIRCUMCISION     Colonscopy     ESOPHAGOGASTRODUODENOSCOPY ENDOSCOPY     Hernia removed from the Back     HERNIA REPAIR     right side   MOUTH SURGERY     TONSILLECTOMY     tumors from back     VASECTOMY      Current Outpatient Medications  Medication Sig Dispense Refill   amLODipine  (NORVASC ) 10 MG tablet Take 1 tablet by mouth daily.  1   aspirin  81 MG chewable tablet Chew 81 mg by mouth daily.     hydrochlorothiazide  (HYDRODIURIL ) 25 MG tablet Take 1 tablet by mouth every morning.  1   hydroxyurea  (HYDREA ) 500 MG capsule Take 500 mg by mouth daily. May take with food to minimize GI side effects.     metoprolol  tartrate (LOPRESSOR ) 25 MG tablet Take 1 tablet by mouth 2 (two) times daily.  1   omega-3 acid ethyl esters (LOVAZA ) 1 G capsule Take 1 capsule (1 g total) by mouth daily. 30 capsule 0   pantoprazole  (PROTONIX ) 40 MG tablet Take 1 tablet by mouth daily.     Potassium Chloride  ER 20 MEQ TBCR Take 20 mEq by mouth daily. 6 tablet 0   omeprazole (PRILOSEC) 20 MG capsule Take 20 mg by mouth daily. (Patient not taking: Reported on 01/22/2024)     No current  facility-administered medications for this visit.    Allergies as of 01/22/2024   (No Known Allergies)    History reviewed. No pertinent family history.  Social History   Socioeconomic History   Marital status: Married    Spouse name: Not on file   Number of children: Not on file   Years of education: Not on file   Highest education level: Not on file  Occupational History   Not on file  Tobacco Use   Smoking status: Never   Smokeless tobacco: Never  Substance and Sexual Activity   Alcohol use: No   Drug use: No   Sexual activity: Not Currently  Other Topics Concern   Not on file   Social History Narrative   Not on file   Social Drivers of Health   Financial Resource Strain: Low Risk  (11/19/2023)   Received from [facility]   Overall Financial Resource Strain (CARDIA)    Difficulty of Paying Living Expenses: Not hard at all  Food Insecurity: No Food Insecurity (11/19/2023)   Received from [facility]   Hunger Vital Sign    Worried About Running Out of Food in the Last Year: Never true    Ran Out of Food in the Last Year: Never true  Transportation Needs: No Transportation Needs (11/19/2023)   Received from [facility] - Transportation    Lack of Transportation (Medical): No    Lack of Transportation (Non-Medical): No  Physical Activity: Insufficiently Active (07/06/2023)   Received from [facility]   Exercise Vital Sign    Days of Exercise per Week: 2 days    Minutes of Exercise per Session: 30 min  Stress: No Stress Concern Present (07/06/2023)   Received from [facility] - Occupational Stress Questionnaire    Feeling of Stress : Only a little  Social Connections: Socially Integrated (07/06/2023)   Received from [facility]   Social Network    How would you rate your social network (family, work, friends)?: Good participation with social networks  Intimate Partner Violence: Not At Risk (07/06/2023)   Received from [facility]   HITS    Over the last 12 months how often did your partner physically hurt you?: Never    Over the last 12 months how often did your partner insult you or talk down to you?: Sometimes    Over the last 12 months how often did your partner threaten you with physical harm?: Never    Over the last 12 months how often did your partner scream or curse at you?: Never    Review of Systems:    Constitutional: No weight loss, fever, chills, weakness or fatigue HEENT: Eyes: No change in vision               Ears, Nose, Throat:  No change in hearing or congestion Skin: No  rash or itching Cardiovascular: No chest pain, chest pressure or palpitations   Respiratory: No SOB or cough Gastrointestinal: See HPI and otherwise negative Genitourinary: No dysuria or change in urinary frequency Neurological: No headache, dizziness or syncope Musculoskeletal: No new muscle or joint pain Hematologic: No bleeding or bruising Psychiatric: No history of depression or anxiety    Physical Exam:  Vital signs: Ht 5\' 8"  (1.727 m)   Wt 179 lb 6 oz (81.4 kg)   BMI 27.27 kg/m   Constitutional: NAD, alert and cooperative.  Appears younger than stated age Head:  Normocephalic  and atraumatic. Eyes:   PEERL, EOMI. No icterus. Conjunctiva pink. Respiratory: Respirations even and unlabored. Lungs clear to auscultation bilaterally.   No wheezes, crackles, or rhonchi.  Cardiovascular:  Regular rate and rhythm. No peripheral edema, cyanosis or pallor.  Gastrointestinal:  Soft, nondistended, nontender. No rebound or guarding. Normal bowel sounds. No appreciable masses or hepatomegaly. Rectal:  Declines Msk:  Symmetrical without gross deformities. Without edema, no deformity or joint abnormality.  Neurologic:  Alert and  oriented x4;  grossly normal neurologically.  Skin:   Dry and intact without significant lesions or rashes. Psychiatric: Oriented to person, place and time. Demonstrates good judgement and reason without abnormal affect or behaviors.  RELEVANT LABS AND IMAGING: CBC    Component Value Date/Time   WBC 10.0 04/08/2015 0345   RBC 4.33 04/08/2015 0345   HGB 15.0 04/08/2015 0345   HCT 42.7 04/08/2015 0345   PLT 248 04/08/2015 0345   MCV 98.6 04/08/2015 0345   MCH 34.6 (H) 04/08/2015 0345   MCHC 35.1 04/08/2015 0345   RDW 12.2 04/08/2015 0345   LYMPHSABS 1.4 04/07/2015 0429   MONOABS 0.6 04/07/2015 0429   EOSABS 0.1 04/07/2015 0429   BASOSABS 0.1 04/07/2015 0429    CMP     Component Value Date/Time   NA 134 (L) 04/08/2015 0345   K 2.6 (LL) 04/08/2015 0345    CL 96 (L) 04/08/2015 0345   CO2 29 04/08/2015 0345   GLUCOSE 181 (H) 04/08/2015 0345   BUN 13 04/08/2015 0345   CREATININE 1.09 04/08/2015 0345   CALCIUM 9.2 04/08/2015 0345   GFRNONAA >60 04/08/2015 0345   GFRAA >60 04/08/2015 0345     Assessment/Plan:   Assessment & Plan  GERD EGD in 2019 for melena and GERD.  [facility]  Recent CBC with no evidence of anemia. Long-standing GERD, well-controlled omeprazole 20 Mg once daily recently put on pantoprazole  40 Mg once daily with no change in his hoarseness, cough, dysphagia.   - Obtain EGD from 2019 - Continue pantoprazole  40 Mg once daily for adequate control of GERD - Educated patient on lifestyle modifications  Dysphagia Intermittent dysphagia, more pronounced with liquids. Possible motility issue  - Order barium swallow to assess esophageal motility and check for reflux or structural abnormalities.  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  Colonoscopy in 2019 for same.  We will obtain this record - Recommend starting a fiber supplement such as Benefiber or Citrucel to regulate bowel movements. - Consider colestipol if no improvement with fiber - Obtain previous colonoscopy record from 2019 at [facility]  Chronic cough and hoarseness Change of voice Chronic cough and hoarseness which is not associated with eating and more prominent with overuse of voice.  Suspect ENT related.  Less likely GI related with no improvement with increasing antacid and no association with eating. - Refer to ENT for evaluation (patient prefers [facility])  Assigned to Dr. Cherryl Corona this morning  Suzanna Erp, PA-C [facility] 01/22/2024, 10:16 AM  Cc: Emaline Handsome, MD

## 2024-01-22 ENCOUNTER — Ambulatory Visit: Admitting: Gastroenterology

## 2024-01-22 ENCOUNTER — Encounter: Payer: Self-pay | Admitting: Gastroenterology

## 2024-01-22 VITALS — Ht 68.0 in | Wt 179.4 lb

## 2024-01-22 DIAGNOSIS — R1310 Dysphagia, unspecified: Secondary | ICD-10-CM | POA: Diagnosis not present

## 2024-01-22 DIAGNOSIS — R499 Unspecified voice and resonance disorder: Secondary | ICD-10-CM | POA: Diagnosis not present

## 2024-01-22 DIAGNOSIS — R490 Dysphonia: Secondary | ICD-10-CM

## 2024-01-22 DIAGNOSIS — R059 Cough, unspecified: Secondary | ICD-10-CM

## 2024-01-22 DIAGNOSIS — K219 Gastro-esophageal reflux disease without esophagitis: Secondary | ICD-10-CM | POA: Diagnosis not present

## 2024-01-22 DIAGNOSIS — R194 Change in bowel habit: Secondary | ICD-10-CM

## 2024-01-22 DIAGNOSIS — R053 Chronic cough: Secondary | ICD-10-CM

## 2024-01-22 NOTE — Patient Instructions (Addendum)
 A high fiber diet with plenty of fluids (up to 8 glasses of water daily) is suggested to relieve these symptoms. Benefiber, 1 tablespoon once or twice daily can be used to keep bowels regular if needed.   We have sent your referral to [facility] [address] [place], [place] [address] Phone: [phone]  You will also be receiving a call from  [facility] [address] [place], [place] [address] Phone: [phone] to schedule your barium swallow.  Please follow up as needed if symptoms increase or worsen  Due to recent changes in healthcare laws, you may see the results of your imaging and laboratory studies on MyChart before your provider has had a chance to review them.  We understand that in some cases there may be results that are confusing or concerning to you. Not all laboratory results come back in the same time frame and the provider may be waiting for multiple results in order to interpret others.  Please give us  48 hours in order for your provider to thoroughly review all the results before contacting the office for clarification of your results.   _______________________________________________________  If your blood pressure at your visit was 140/90 or greater, please contact your primary care physician to follow up on this.  _______________________________________________________  If you are age 82 or older, your body mass index should be between 23-30. Your Body mass index is 27.27 kg/m. If this is out of the aforementioned range listed, please consider follow up with your Primary Care Provider.  If you are age 15 or younger, your body mass index should be between 19-25. Your Body mass index is 27.27 kg/m. If this is out of the aformentioned range listed, please consider follow up with your Primary Care Provider.   ________________________________________________________  The [facility] providers  would like to encourage you to use MYCHART to communicate with providers for non-urgent requests or questions.  Due to long hold times on the telephone, sending your provider a message by [facility] may be a faster and more efficient way to get a response.  Please allow 48 business hours for a response.  Please remember that this is for non-urgent requests.  _______________________________________________________ Thank you for trusting me with your gastrointestinal care!   Suzanna Erp, PA

## 2024-01-29 NOTE — Progress Notes (Signed)
 Patient ID:  Philip Gay is a 83 y.o. (DOB 05/03/1941) male.  Assessment and Plan   1. Essential hypertension   2. DM type 2 with diabetic dyslipidemia (*)   3. Stage 3a chronic kidney disease (*)   4. Vitamin D deficiency   5. Hoarse     Assessment & Plan 1. Hypertension: Stable. - Continue current antihypertensive medications.  2. Diabetes Mellitus. - Conduct A1c test today.  3. Hyperlipidemia. - Conduct lipid panel today. - Continue current fenofibrate regimen for cholesterol management.  4. Stage 3 Kidney Disease: Chronic. - Creatinine levels consistently slightly above normal range. - GFR below normal range. - Maintain adequate hydration. - Avoid NSAIDs such as Advil, Aleve, Motrin, and ibuprofen. - Referral to nephrologist for annual check-ups.  5. Vitamin D Deficiency. - Conduct vitamin D level test today.  6. Hoarseness. - Referral to [facility] in [place] for second opinion.  7. Skin Cancer. - Recent surgeries on hands. - Remind dermatologist to send over records for review.  Follow-up - Labs today. - Follow-up appointment in 3 months.   Results Labs  - Creatinine: Slightly above normal  - GFR: Below normal  HM needs:   Patient understands and agrees with plan. Computer technology was used to create visit note. Consent from the patient/caregiver was obtained prior to use.  Patient verbalized to me that they understood what their problem is, what they need to do about it and why it is important that they do it. I have reviewed the information contained in this note and personally verified its accuracy.  I obtained the history of present illness and personally performed the physical exam Voice recognition software was used in creation of this note . Despite my best efforts at editing the text, some misspellings / errors  may have occurred.   No follow-ups on file.    Patient's Medications       * Accurate as of January 29, 2024  9:33 AM.  Reflects encounter med changes as of last refresh          Continued Medications      Instructions  amLODIPine  besylate 10 mg tablet Commonly known as: NORVASC   10 mg, Oral, Daily   ASPIRIN  LOW DOSE EC tablet Generic drug: aspirin   81 mg, Oral, Daily   ergocalciferol 50,000 units Caps capsule Commonly known as: Vitamin D2  50,000 Units, Oral, Weekly at 0900, Once a week for 12 weeks   fenofibrate 145 MG tablet  145 mg, Oral, Daily   hydroxyurea  500 MG capsule Commonly known as: HYDREA   TAKE 1 CAPSULE BY MOUTH TWICE DAILY FOR 5 DAYS (MON, TUES, WED, THU, & FRI), AND 1 CAPSULE BY MOUTH ON SAT, & SUN   levocetirizine 5 mg tablet Commonly known as: XYZAL  5 mg, Every evening   metoprolol  tartrate 25 mg tablet Commonly known as: LOPRESSOR   TAKE 1 TABLET BY MOUTH TWICE A DAY   pantoprazole  sodium 40 mg tablet Commonly known as: PROTONIX   40 mg, Oral, Daily   spironolactone 50 mg tablet Commonly known as: ALDACTONE  50 mg, Oral, Daily        Orders Placed This Encounter  Procedures  . Hemoglobin A1c  . LP+Non-HDL Cholesterol  . Ambulatory referral to Gastroenterology  . Ambulatory referral to Nephrology    Risks, benefits, and alternatives of the medications and treatment plan prescribed today were discussed, and patient expressed understanding. Plan follow-up as discussed or as needed if any worsening symptoms or change  in condition.    A yearly preventative health exam was recommended and current age based recommendations were discussed.   Subjective   Patient ID:  Philip Gay is a 83 y.o. (DOB 1941/05/03) male No chief complaint on file.    HPI:    History of Present Illness The patient is here for a follow-up of high blood pressure, diabetes, hyperlipidemia, stage 3 kidney disease, and vitamin D deficiency.  He reports overall good health but continues to experience hoarseness. He was previously evaluated by an ENT specialist in 09/2023,  who attributed the hoarseness to acid reflux and prescribed pantoprazole . Despite this treatment, the hoarseness persists.  He was then further evaluated by [facility] A gastroenterologist did not recommend an endoscopy and suggested that the hoarseness may not be related to gastrointestinal issues. He has been referred back to the ENT specialist and has an upcoming appointment. No imaging studies have been conducted.  He has not consulted a nephrologist following his illness in 08/2022. Kidney function is reported to be stable but higher than normal. There is no chest pain or shortness of breath, and energy levels are good. He engages in physical activity such as walking 2 miles at least 4 days a week, sometimes increasing to 6 miles.  A history of skin cancer on his hands is noted, with one lesion being superficial and the other requiring surgical intervention. The surgical site is not healing optimally. He is under the care of Dr. Samule [facility] at [facility].  PAST SURGICAL HISTORY: - Surgical intervention for skin cancer on hands  FAMILY HISTORY His sister died in Jul 26, 2024.    Past Medical History, Past Surgery History, Allergies, Social History, and Family History were reviewed and updated.    Review of Systems   Objective   Vitals:   01/29/24 0859  BP: 134/76  Pulse: 72  Temp: 98.2 F (36.8 C)  TempSrc: Oral  Resp: 14  Height: 5' 8 (1.727 m)  Weight: 179 lb 6.4 oz (81.4 kg)  SpO2: 99%  BMI (Calculated): 27.3  PainSc: 0-No pain      Physical Exam Vitals and nursing note reviewed.  Constitutional:      General: He is not in acute distress.    Appearance: Normal appearance. He is well-developed and normal weight. He is not ill-appearing, toxic-appearing or diaphoretic.  HENT:     Head: Normocephalic and atraumatic.  Eyes:     General: No scleral icterus.       Right eye: No discharge.        Left eye: No discharge.     Extraocular Movements: Extraocular  movements intact.     Conjunctiva/sclera: Conjunctivae normal.     Pupils: Pupils are equal, round, and reactive to light.  Cardiovascular:     Rate and Rhythm: Normal rate and regular rhythm.     Heart sounds: Normal heart sounds. No murmur heard.    No friction rub. No gallop.  Musculoskeletal:        General: Normal range of motion.     Cervical back: Normal range of motion and neck supple. No rigidity.  Pulmonary:     Effort: Pulmonary effort is normal. No respiratory distress.     Breath sounds: Normal breath sounds. No stridor. No wheezing, rhonchi or rales.  Chest:     Chest wall: No tenderness.  Lymphadenopathy:     Cervical: No cervical adenopathy.  Skin:    General: Skin is warm and dry.  Neurological:     Mental Status: He is alert and oriented to person, place, and time. Mental status is at baseline.     Deep Tendon Reflexes: Reflexes are normal and symmetric.  Psychiatric:        Mood and Affect: Mood normal.        Behavior: Behavior normal.        Thought Content: Thought content normal.        Judgment: Judgment normal.        Voice recognition software was used and creation of this note. Despite my best effort at editing the text, some misspelling/errors may have occurred. SDoH Screening Completed SDoH screening was reviewed as documented with appropriate diagnoses added based on screening.  Time spent performing SDoH screening, documenting as appropriate, and entering intervention when necessary: less than 5 minutes *Some images could not be shown.

## 2024-01-29 NOTE — Progress Notes (Signed)
 Agree with the assessment and plan as outlined by Suzanna Erp, PA-C.  Agree hoarseness unlikely to be from reflux.  EGD could be performed to assess for evidence of reflux, but likely low yield.
